# Patient Record
Sex: Male | Born: 1965 | Race: White | Hispanic: No | Marital: Married | Smoking: Never smoker
Health system: Southern US, Community
[De-identification: ages and names within clinical notes are randomized; demographics above are authoritative.]

## PROBLEM LIST (undated history)

## (undated) HISTORY — PX: KNEE SURGERY: SHX244

---

## 2009-09-27 ENCOUNTER — Encounter: Admission: RE | Admit: 2009-09-27 | Discharge: 2009-09-27 | Payer: Self-pay | Admitting: Orthopedic Surgery

## 2009-09-27 ENCOUNTER — Inpatient Hospital Stay (HOSPITAL_COMMUNITY): Admission: AD | Admit: 2009-09-27 | Discharge: 2009-09-29 | Payer: Self-pay | Admitting: Orthopedic Surgery

## 2010-09-15 IMAGING — RF DG FLUORO GUIDE SPINAL/SI JT INJ*R*
1 series · 1 of 1 positions shown · non-contrast
Comparison: none

CLINICAL DATA: Severe low back pain after playing soccer.  MR
demonstrates small left paracentral protrusion L4-5 without
compressive pathology.  No prior lumbar surgery.

[Series 1: run · 1 of 1 slices shown]
[im 1/1]
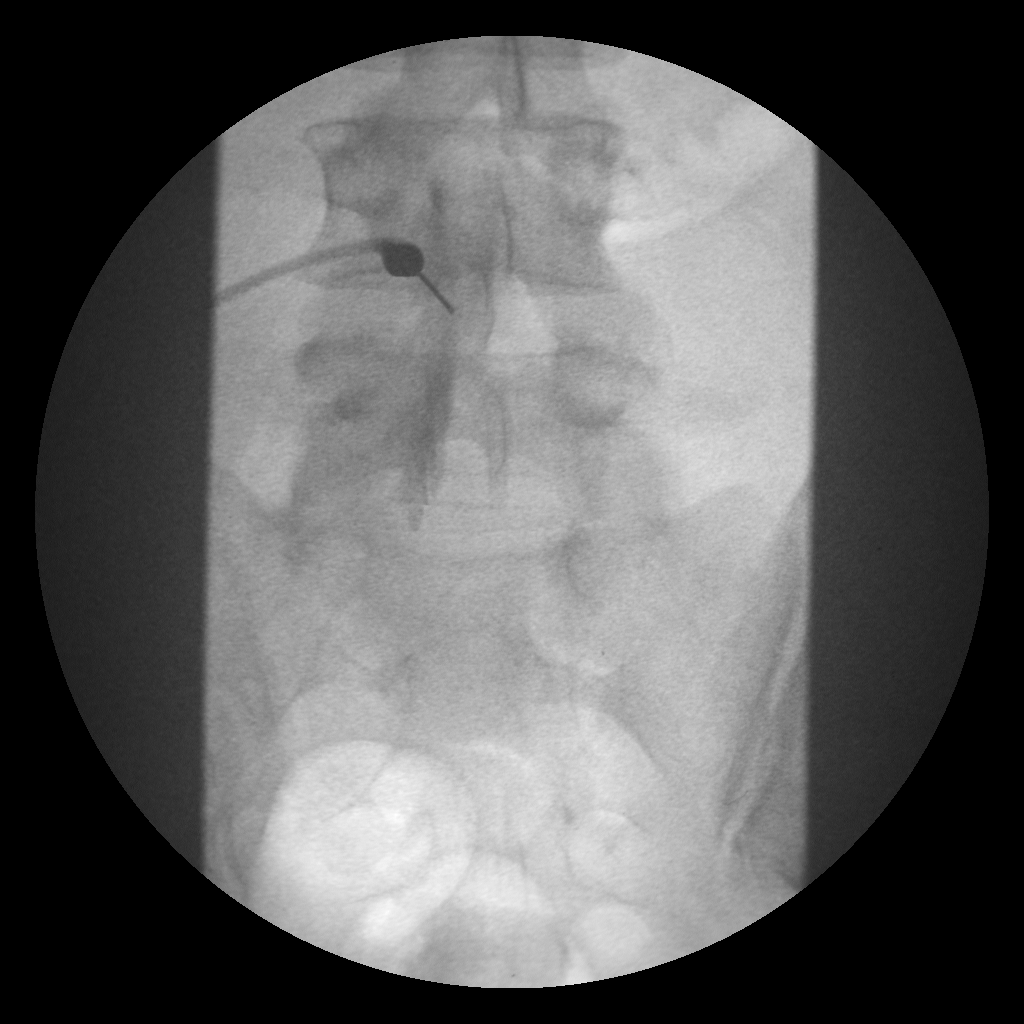

[1 of 1 positions shown; findings below may reference images not displayed]

Procedure: The procedure, risks, benefits, and alternatives were
explained to the patient. Questions regarding the procedure were
encouraged and answered. The patient understands and consents to
the procedure.

LUMBAR EPIDURAL INJECTION: An interlaminar approach was performed
on the left at L4-5.  The overlying skin was cleansed and
anesthetized.  A 20 gauge Crawford epidural needle was advanced
using loss-of-resistance technique.

DIAGNOSTIC EPIDURAL INJECTION: Injection of Omnipaque 180 shows a
good epidural pattern with spread above and below the level of
needle placement. No intrathecal or vascular opacification is seen.

THERAPEUTIC EPIDURAL INJECTION: 120mg of Depo-Medrol mixed with 5ml
lidocaine 1% were instilled.  The procedure was well-tolerated, and
the patient was discharged thirty minutes following the injection
in good condition.

Complications: none
IMPRESSION: Technically successful epidural injection on the left
at L4-5.

## 2010-11-14 LAB — BASIC METABOLIC PANEL
CO2: 24 mEq/L (ref 19–32)
Calcium: 9 mg/dL (ref 8.4–10.5)
Chloride: 104 mEq/L (ref 96–112)
Creatinine, Ser: 0.93 mg/dL (ref 0.4–1.5)
Glucose, Bld: 126 mg/dL — ABNORMAL HIGH (ref 70–99)
Sodium: 136 mEq/L (ref 135–145)

## 2010-11-14 LAB — CBC
MCV: 95.7 fL (ref 78.0–100.0)
RDW: 11.9 % (ref 11.5–15.5)
WBC: 11.3 10*3/uL — ABNORMAL HIGH (ref 4.0–10.5)

## 2010-11-14 LAB — PROTIME-INR
INR: 1.06 (ref 0.00–1.49)
Prothrombin Time: 13.7 seconds (ref 11.6–15.2)

## 2013-10-13 ENCOUNTER — Encounter: Payer: Self-pay | Admitting: Emergency Medicine

## 2013-10-13 ENCOUNTER — Emergency Department
Admission: EM | Admit: 2013-10-13 | Discharge: 2013-10-13 | Disposition: A | Discharge status: Home / Self Care | Payer: BC Managed Care – PPO | Source: Home / Self Care | Attending: Emergency Medicine | Admitting: Emergency Medicine

## 2013-10-13 DIAGNOSIS — J0100 Acute maxillary sinusitis, unspecified: Secondary | ICD-10-CM

## 2013-10-13 DIAGNOSIS — J209 Acute bronchitis, unspecified: Secondary | ICD-10-CM

## 2013-10-13 DIAGNOSIS — J111 Influenza due to unidentified influenza virus with other respiratory manifestations: Secondary | ICD-10-CM

## 2013-10-13 MED ORDER — METHYLPREDNISOLONE ACETATE 80 MG/ML IJ SUSP
80.0000 mg | Freq: Once | INTRAMUSCULAR | Status: AC
  Administered 2013-10-13: 80 mg via INTRAMUSCULAR

## 2013-10-13 MED ORDER — OSELTAMIVIR PHOSPHATE 75 MG PO CAPS: ORAL_CAPSULE | ORAL | Status: DC

## 2013-10-13 NOTE — ED Notes (Signed)
Biff c/o sinus pain, HA, congestion, and cough x 1 week. Low grade fever.

## 2013-10-13 NOTE — ED Provider Notes (Signed)
CSN: [id number]     Arrival date & time 10/13/13  1620 History   First MD Initiated Contact with Patient 10/13/13 1650     Chief Complaint  Patient presents with  . Sinus Problem  . Nasal Congestion   Molly MaduroRobert is here with his wife.  Calib c/o sinus pain, HA, congestion, and cough x 1 week.-then develop discolored nasal discharge, chest congestion, cough, nonproductive.  HPI Separate from the above symptoms for the past week, the following acute symptoms started abruptly at 3 AM this morning:  HPI : Fever to 102 with chills, sweats, myalgias, fatigue, headache. Symptoms are progressively worsening, despite trying OTC fever reducing medicine and rest and fluids. Has decreased appetite, but tolerating some liquids by mouth. No history of recent tick bite. His wife just recovered from influenza.  Review of Systems: Positive for fatigue,  nasal congestion, mild sore throat, mild swollen anterior neck glands, mild cough. Occasional wheezes, and he states that he can get wheezes in the past when he gets acute bronchitis, although he denies history of asthma. He states that steroid shot has worked well in the past for wheezing in this situation. Negative for acute vision changes, stiff neck, focal weakness, syncope, seizures, respiratory distress, vomiting, diarrhea, GU symptoms, new Rash.   History reviewed. No pertinent past medical history. Past Surgical History  Procedure Laterality Date  . Knee surgery     Family History  Problem Relation Age of Onset  . Cancer Mother   . Heart disease Father    History  Substance Use Topics  . Smoking status: Never Smoker   . Smokeless tobacco: Never Used  . Alcohol Use: No    Review of Systems  All other systems reviewed and are negative.      Allergies  Review of patient's allergies indicates no known allergies.  Home Medications   Current Outpatient Rx  Name  Route  Sig  Dispense  Refill  . oseltamivir (TAMIFLU) 75 MG capsule       Starting today, take 1 capsule by mouth twice a day for 5 days.   10 capsule   0    BP 134/85  Pulse 88  Temp(Src) 98.2 F (36.8 C) (Oral)  Resp 16  Ht 5\' 11"  (1.803 m)  Wt 214 lb (97.07 kg)  BMI 29.86 kg/m2  SpO2 97% Physical Exam  Nursing note and vitals reviewed. Constitutional: He is oriented to person, place, and time. He appears well-developed and well-nourished. He is cooperative.  Non-toxic appearance. He appears ill. No distress.  HENT:  Head: Normocephalic and atraumatic.  Right Ear: Tympanic membrane, external ear and ear canal normal.  Left Ear: Tympanic membrane, external ear and ear canal normal.  Nose: Mucosal edema and rhinorrhea present. Right sinus exhibits maxillary sinus tenderness. Left sinus exhibits maxillary sinus tenderness.  Mouth/Throat: Oropharynx is clear and moist. No oral lesions. No oropharyngeal exudate.  Eyes: Right eye exhibits no discharge. Left eye exhibits no discharge. No scleral icterus.  Neck: Neck supple.  Cardiovascular: Normal rate, regular rhythm and normal heart sounds.   Pulmonary/Chest: Effort normal. No respiratory distress. He has no decreased breath sounds. He has wheezes (mild late expiratory wheezes anterior lung field.--no other wheezes otherwise.). He has rhonchi. He has no rales.  Lymphadenopathy:    He has no cervical adenopathy.  Neurological: He is alert and oriented to person, place, and time.  Skin: Skin is warm. No rash noted. He is diaphoretic.  Psychiatric: He has a normal mood  and affect.    ED Course  Procedures (including critical care time) Labs Review Labs Reviewed - No data to display Imaging Review No results found.    MDM   Final diagnoses:  Influenza with other respiratory manifestations  Acute maxillary sinusitis  Acute bronchitis    He likely has 2 separate infections. The one the past week is likely bacterial acute sinusitis/bronchitis. Separate from this, based on history of acute  abrupt onset of typical influenza symptoms, exposure to someone with the flu, as well as the flu epidemic in this region, he likely has influenza. Risk, benefits, alternatives discussed. He declined any imaging or testing. He agrees with the following plan: Tamiflu 75 mg BID times five days. Zithromax Z Pak Depo-Medrol 80 mg IM stat Flonase Other symptomatic care discussed Precautions discussed. Follow-up with your primary care doctor in 5-7 days if not improving, or sooner if symptoms become worse. Red flags discussed. Questions invited and answered. Patient and wife voiced understanding and agreement.    Lajean Manes, MD 10/13/13 [phone]

## 2017-11-26 ENCOUNTER — Encounter: Payer: Self-pay | Admitting: *Deleted

## 2017-11-26 ENCOUNTER — Emergency Department
Admission: EM | Admit: 2017-11-26 | Discharge: 2017-11-26 | Disposition: A | Discharge status: Home / Self Care | Payer: BLUE CROSS/BLUE SHIELD | Source: Home / Self Care | Attending: Family Medicine | Admitting: Family Medicine

## 2017-11-26 ENCOUNTER — Other Ambulatory Visit: Payer: Self-pay

## 2017-11-26 DIAGNOSIS — H579 Unspecified disorder of eye and adnexa: Secondary | ICD-10-CM

## 2017-11-26 MED ORDER — SULFACETAMIDE SODIUM 10 % OP SOLN: 1.0000 [drp] | OPHTHALMIC | 0 refills | Status: AC

## 2017-11-26 MED ORDER — KETOROLAC TROMETHAMINE 0.5 % OP SOLN: 1.0000 [drp] | Freq: Four times a day (QID) | OPHTHALMIC | 0 refills | Status: AC

## 2017-11-26 NOTE — ED Provider Notes (Signed)
[facility]    CSN: [id number] Arrival date & time: 11/26/17  1825     History   Chief Complaint Chief Complaint  Patient presents with  . Eye Injury    HPI Jordan Bolton is a 52 y.o. male.   Patient reports that he was sawing with a circular saw and got dust in his left eye.  He lavaged his eye but still has a foreign body sensation.  No changes in vision   Foreign Body in Eye  This is a new problem. The current episode started yesterday. The problem occurs constantly. The problem has not changed since onset.Nothing aggravates the symptoms. Nothing relieves the symptoms. Treatments tried: eye lavage. The treatment provided no relief.    History reviewed. No pertinent past medical history.  There are no active problems to display for this patient.   Past Surgical History:  Procedure Laterality Date  . KNEE SURGERY         Home Medications    Prior to Admission medications   Medication Sig Start Date End Date Taking? Authorizing Provider  ketorolac (ACULAR) 0.5 % ophthalmic solution Place 1 drop into the left eye 4 (four) times daily. 11/26/17   Lattie HawBeese, Howell Groesbeck A, MD  sulfacetamide (BLEPH-10) 10 % ophthalmic solution Place 1-2 drops into the left eye every 3 (three) hours while awake. 11/26/17   Lattie HawBeese, Bron Snellings A, MD    Family History Family History  Problem Relation Age of Onset  . Cancer Mother   . Heart disease Father     Social History Social History   Tobacco Use  . Smoking status: Never Smoker  . Smokeless tobacco: Never Used  Substance Use Topics  . Alcohol use: No  . Drug use: No     Allergies   Patient has no known allergies.   Review of Systems Review of Systems  All other systems reviewed and are negative.    Physical Exam Triage Vital Signs ED Triage Vitals  Enc Vitals Group     BP 11/26/17 1858 134/81     Pulse Rate 11/26/17 1858 67     Resp 11/26/17 1858 18     Temp 11/26/17 1858 98.3 F (36.8 C)     Temp Source  11/26/17 1858 Oral     SpO2 11/26/17 1858 98 %     Weight 11/26/17 1859 214 lb (97.1 kg)     Height 11/26/17 1859 5\' 11"  (1.803 m)     Head Circumference --      Peak Flow --      Pain Score 11/26/17 1859 1     Pain Loc --      Pain Edu? --      Excl. in GC? --    No data found.  Updated Vital Signs BP 134/81 (BP Location: Right Arm)   Pulse 67   Temp 98.3 F (36.8 C) (Oral)   Resp 18   Ht 5\' 11"  (1.803 m)   Wt 214 lb (97.1 kg)   SpO2 98%   BMI 29.85 kg/m   Visual Acuity Right Eye Distance:   Left Eye Distance:   Bilateral Distance:    Right Eye Near:   Left Eye Near:    Bilateral Near:     Physical Exam  Constitutional: He appears well-developed and well-nourished. No distress.  HENT:  Head: Normocephalic.  Eyes: Pupils are equal, round, and reactive to light. Conjunctivae, EOM and lids are normal. Lids are everted and swept, no foreign bodies  found. Right eye exhibits no chemosis, no discharge, no exudate and no hordeolum. No foreign body present in the right eye.  Lid eversion left eye reveals no foreign body.  Fluorescein reveals no uptake.  Cardiovascular: Normal rate.  Pulmonary/Chest: Effort normal.  Neurological: He is alert.  Skin: Skin is warm and dry.  Nursing note and vitals reviewed.    UC Treatments / Results  Labs (all labs ordered are listed, but only abnormal results are displayed) Labs Reviewed - No data to display  EKG None Radiology No results found.  Procedures Procedures (including critical care time)  Medications Ordered in UC Medications - No data to display   Initial Impression / Assessment and Plan / UC Course  I have reviewed the triage vital signs and the nursing notes.  Pertinent labs & imaging results that were available during my care of the patient were reviewed by me and considered in my medical decision making (see chart for details).    No foreign body visualized.  No corneal or conjunctival abrasion  noted. Will empirically treat with Acular and sulfacetamide ophthalmic suspension. Followup with ophthalmologist if not improved 3 days.    Final Clinical Impressions(s) / UC Diagnoses   Final diagnoses:  Sensation of foreign body in eye    ED Discharge Orders        Ordered    sulfacetamide (BLEPH-10) 10 % ophthalmic solution  Every  3 hours while awake     11/26/17 1916    ketorolac (ACULAR) 0.5 % ophthalmic solution  4 times daily     11/26/17 1916          Lattie Haw, MD 12/03/17 1112

## 2017-11-26 NOTE — ED Triage Notes (Signed)
Pt c/o LT eye pain x 1 day. He reports getting saw dust in his eye.

## 2021-12-16 ENCOUNTER — Encounter: Payer: Self-pay | Admitting: Podiatry

## 2021-12-16 ENCOUNTER — Ambulatory Visit (INDEPENDENT_AMBULATORY_CARE_PROVIDER_SITE_OTHER): Payer: BC Managed Care – PPO

## 2021-12-16 ENCOUNTER — Ambulatory Visit: Payer: BC Managed Care – PPO | Admitting: Podiatry

## 2021-12-16 DIAGNOSIS — M79671 Pain in right foot: Secondary | ICD-10-CM

## 2021-12-16 DIAGNOSIS — M722 Plantar fascial fibromatosis: Secondary | ICD-10-CM | POA: Diagnosis not present

## 2021-12-16 DIAGNOSIS — M79672 Pain in left foot: Secondary | ICD-10-CM | POA: Diagnosis not present

## 2021-12-16 MED ORDER — MELOXICAM 15 MG PO TABS: 15.0000 mg | ORAL_TABLET | Freq: Every day | ORAL | 0 refills | Status: DC

## 2021-12-16 NOTE — Progress Notes (Signed)
?  Subjective:  ?Patient ID: Jordan Bolton, male    DOB: 01-Oct-1965,   MRN: [id number] ? ?Chief Complaint  ?Patient presents with  ? Foot Pain  ?  bil foot heel - right foot pain worst than left  ? ? ?56 y.o. male presents for concern of bilateral heel pain that has been going on for a few months. Relates right is worse than the left. Relates the pain is constantly in his heel when walking. Has tried new shoes without much difference. Denies other treatments. Not diabetic . Denies any other pedal complaints. Denies n/v/f/c.  ? ?History reviewed. No pertinent past medical history. ? ?Objective:  ?Physical Exam: ?Vascular: DP/PT pulses 2/4 bilateral. CFT <3 seconds. Normal hair growth on digits. No edema.  ?Skin. No lacerations or abrasions bilateral feet.  ?Musculoskeletal: MMT 5/5 bilateral lower extremities in DF, PF, Inversion and Eversion. Deceased ROM in DF of ankle joint. Pain to medial calcaneal tubercle bilateral more so on the right. No pain along PT or achilles tendon. No pain in arch or with calcaneal squeeze.  ?Neurological: Sensation intact to light touch.  ? ?Assessment:  ? ?1. Plantar fasciitis of right foot   ?2. Plantar fasciitis of left foot   ? ? ? ?Plan:  ?Patient was evaluated and treated and all questions answered. ?Discussed plantar fasciitis with patient.  ?X-rays reviewed and discussed with patient. No acute fractures or dislocations noted. Mild spurring noted at inferior calcaneus.  ?Discussed treatment options including, ice, NSAIDS, supportive shoes, bracing, and stretching. Stretching exercises provided to be done on a daily basis.   ?Prescription for meloxicam provided and sent to pharmacy. ?PF brace dispensed.  ?Follow-up 6 weeks or sooner if any problems arise. In the meantime, encouraged to call the office with any questions, concerns, change in symptoms. ? ? ?Lorenda Peck, DPM  ? ? ?

## 2021-12-16 NOTE — Patient Instructions (Signed)

## 2022-01-27 ENCOUNTER — Ambulatory Visit: Payer: BC Managed Care – PPO | Admitting: Podiatry

## 2022-03-03 ENCOUNTER — Ambulatory Visit: Payer: BC Managed Care – PPO | Admitting: Podiatry

## 2022-03-03 ENCOUNTER — Encounter: Payer: Self-pay | Admitting: Podiatry

## 2022-03-03 DIAGNOSIS — M722 Plantar fascial fibromatosis: Secondary | ICD-10-CM | POA: Diagnosis not present

## 2022-03-03 MED ORDER — MELOXICAM 15 MG PO TABS: 15.0000 mg | ORAL_TABLET | Freq: Every day | ORAL | 0 refills | Status: AC

## 2022-03-03 MED ORDER — TRIAMCINOLONE ACETONIDE 10 MG/ML IJ SUSP
10.0000 mg | Freq: Once | INTRAMUSCULAR | Status: AC
  Administered 2022-03-03: 10 mg

## 2022-03-03 NOTE — Patient Instructions (Signed)

## 2022-03-04 NOTE — Progress Notes (Signed)
Subjective:   Patient ID: Jordan Bolton, male   DOB: 56 y.o.   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  I did discuss other treatments we may need to do and hopefully this will solve the problem patient to be seen back orthotics return or earlier if needed

## 2022-03-22 ENCOUNTER — Telehealth: Payer: Self-pay | Admitting: Podiatry

## 2022-03-22 NOTE — Telephone Encounter (Signed)
Lmom to call back to schedule appt to pick up orthotics  

## 2022-04-13 ENCOUNTER — Ambulatory Visit (INDEPENDENT_AMBULATORY_CARE_PROVIDER_SITE_OTHER): Payer: BC Managed Care – PPO

## 2022-04-13 DIAGNOSIS — M722 Plantar fascial fibromatosis: Secondary | ICD-10-CM

## 2022-04-13 NOTE — Progress Notes (Signed)
  Orthotics were dispensed and fit was satisfactory. Reviewed instructions for break-in and wear. Written instructions given to patient.  Patient will follow up as needed.   [facility] - order # [id number]

## 2022-12-02 IMAGING — DX DG FOOT COMPLETE 3+V*R*
3 series · 3 of 3 positions shown · non-contrast
Comparison: None.

CLINICAL DATA: Bilateral heel pain, right greater than left related
to playing pickleball

EXAM:
RIGHT FOOT COMPLETE - 3+ VIEW

[foot ap wb]
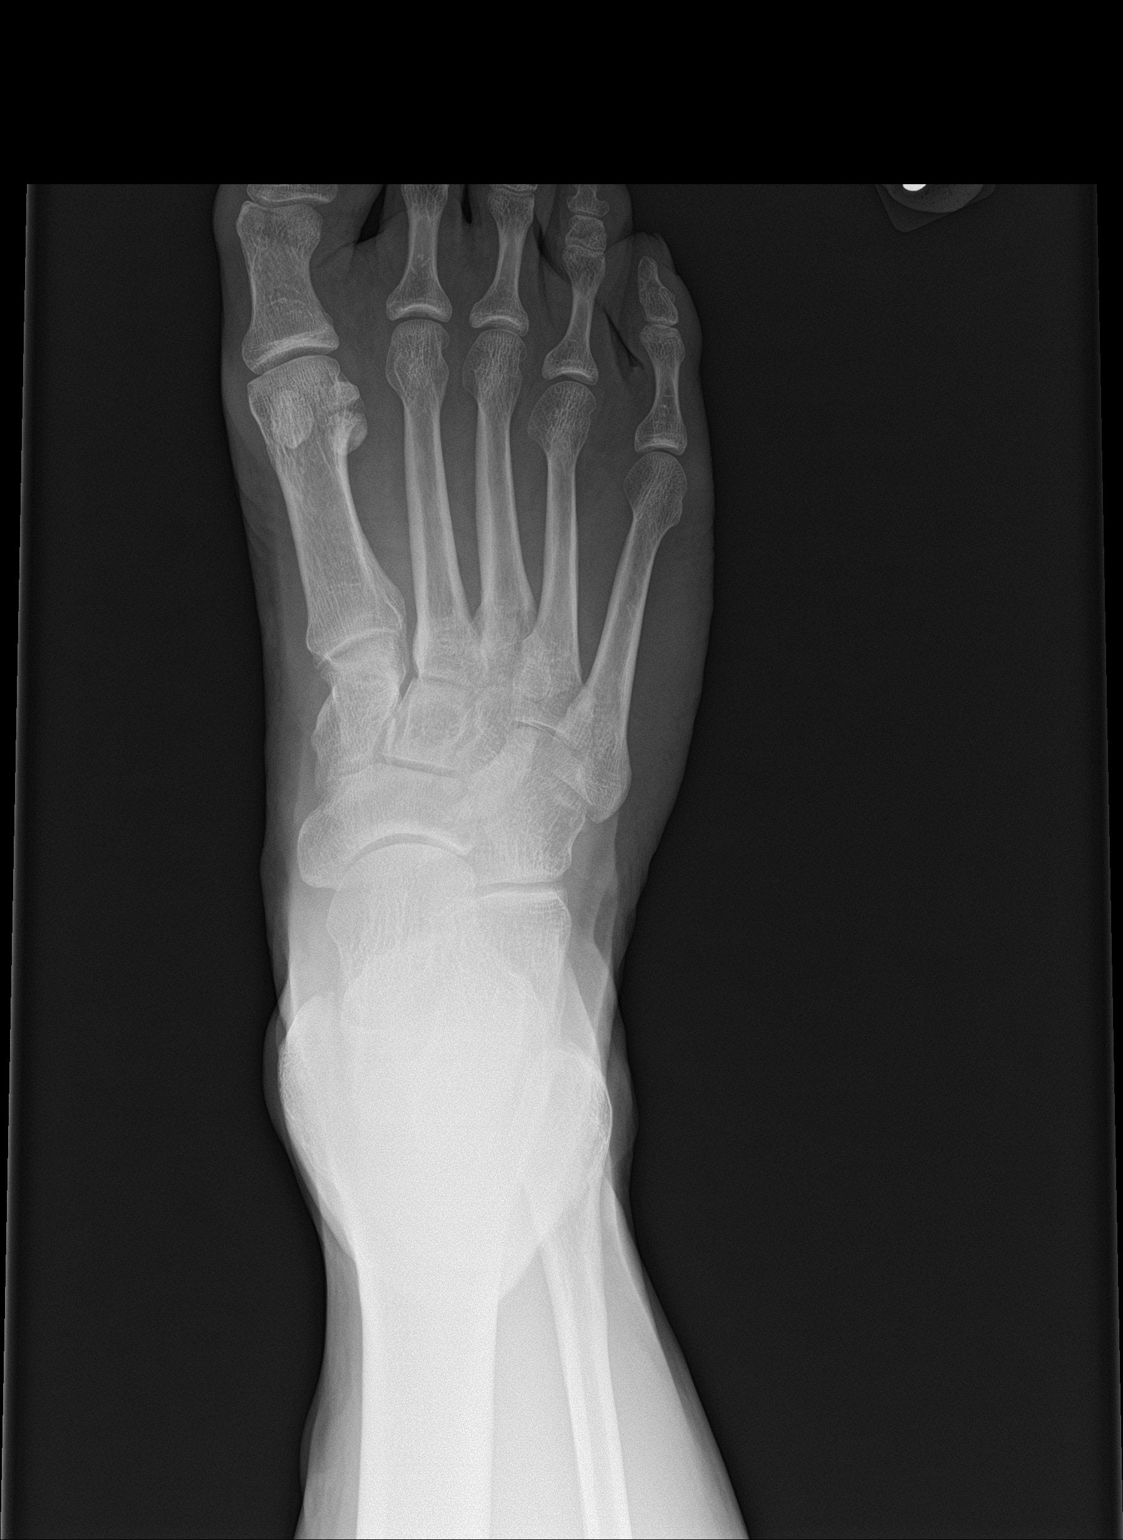

[foot obl wb]
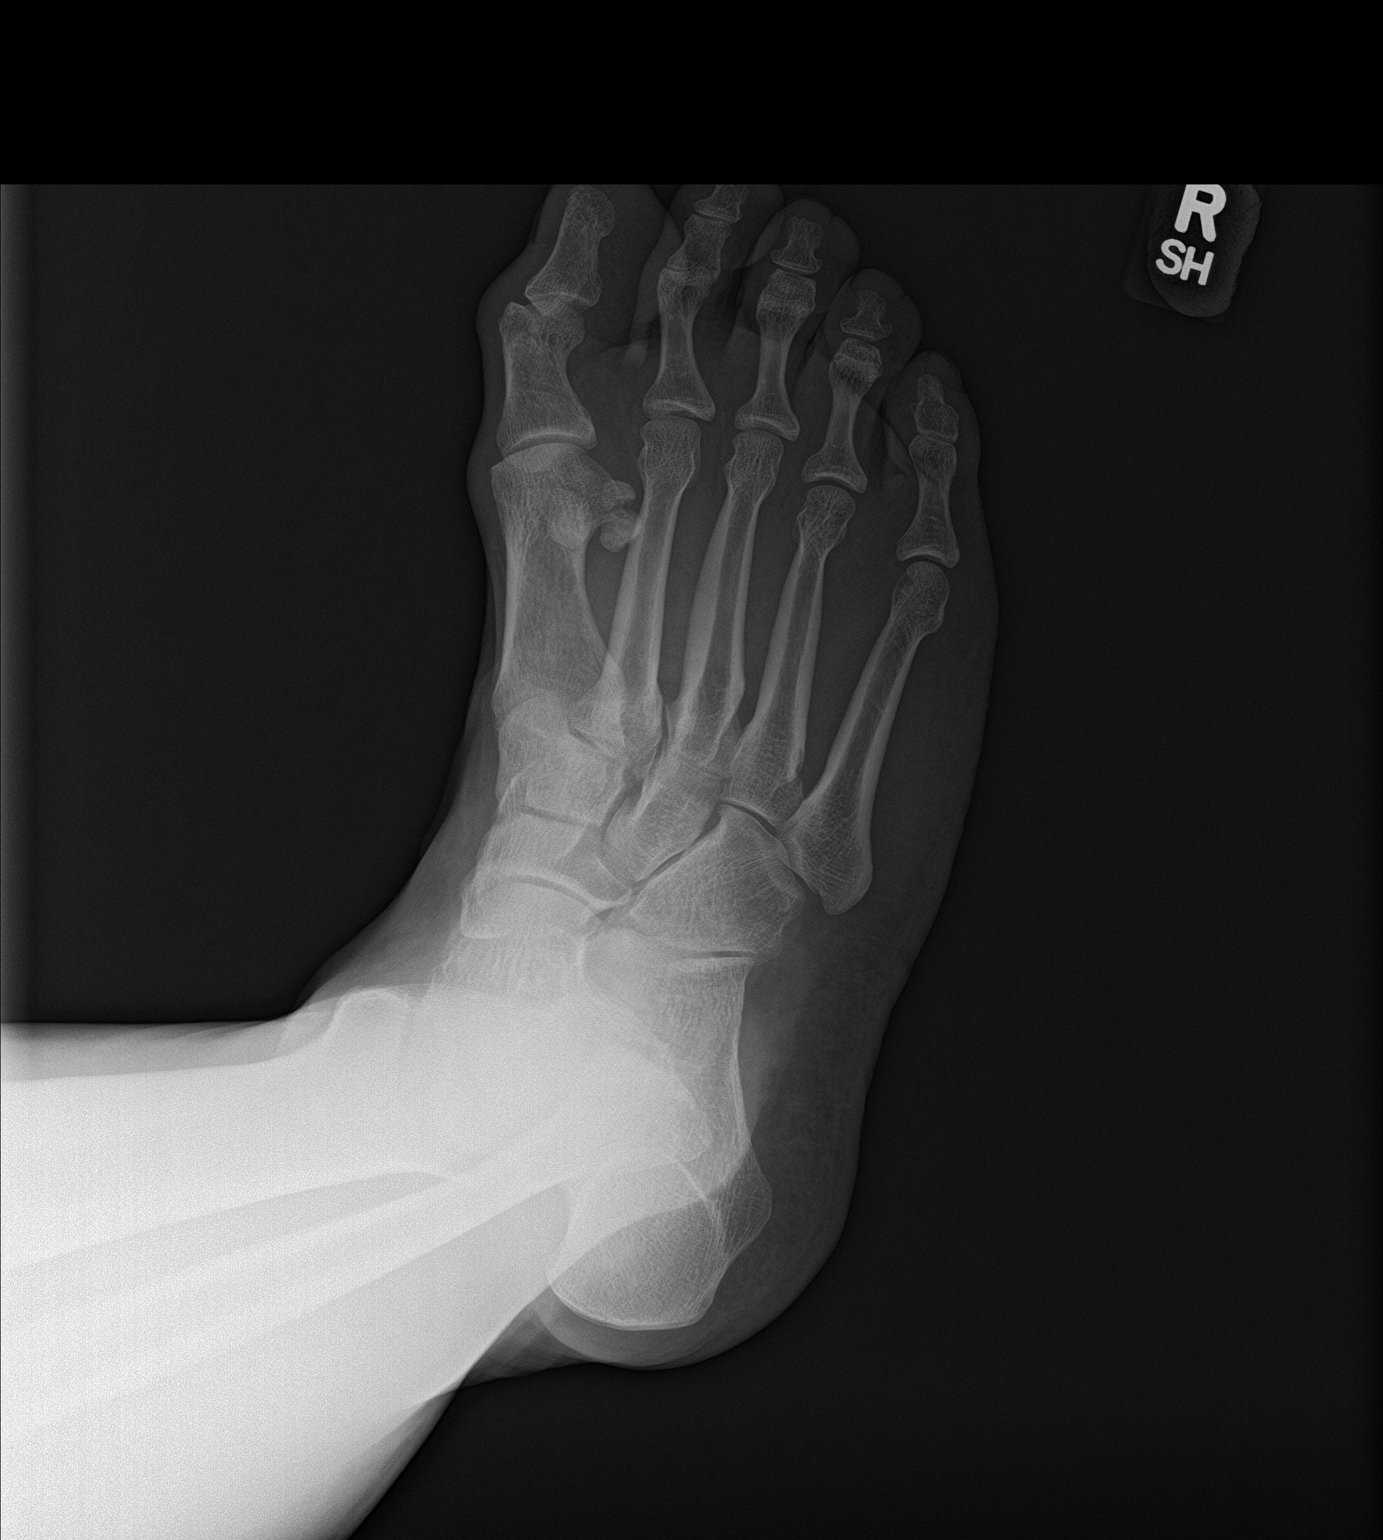

[foot lat wb]
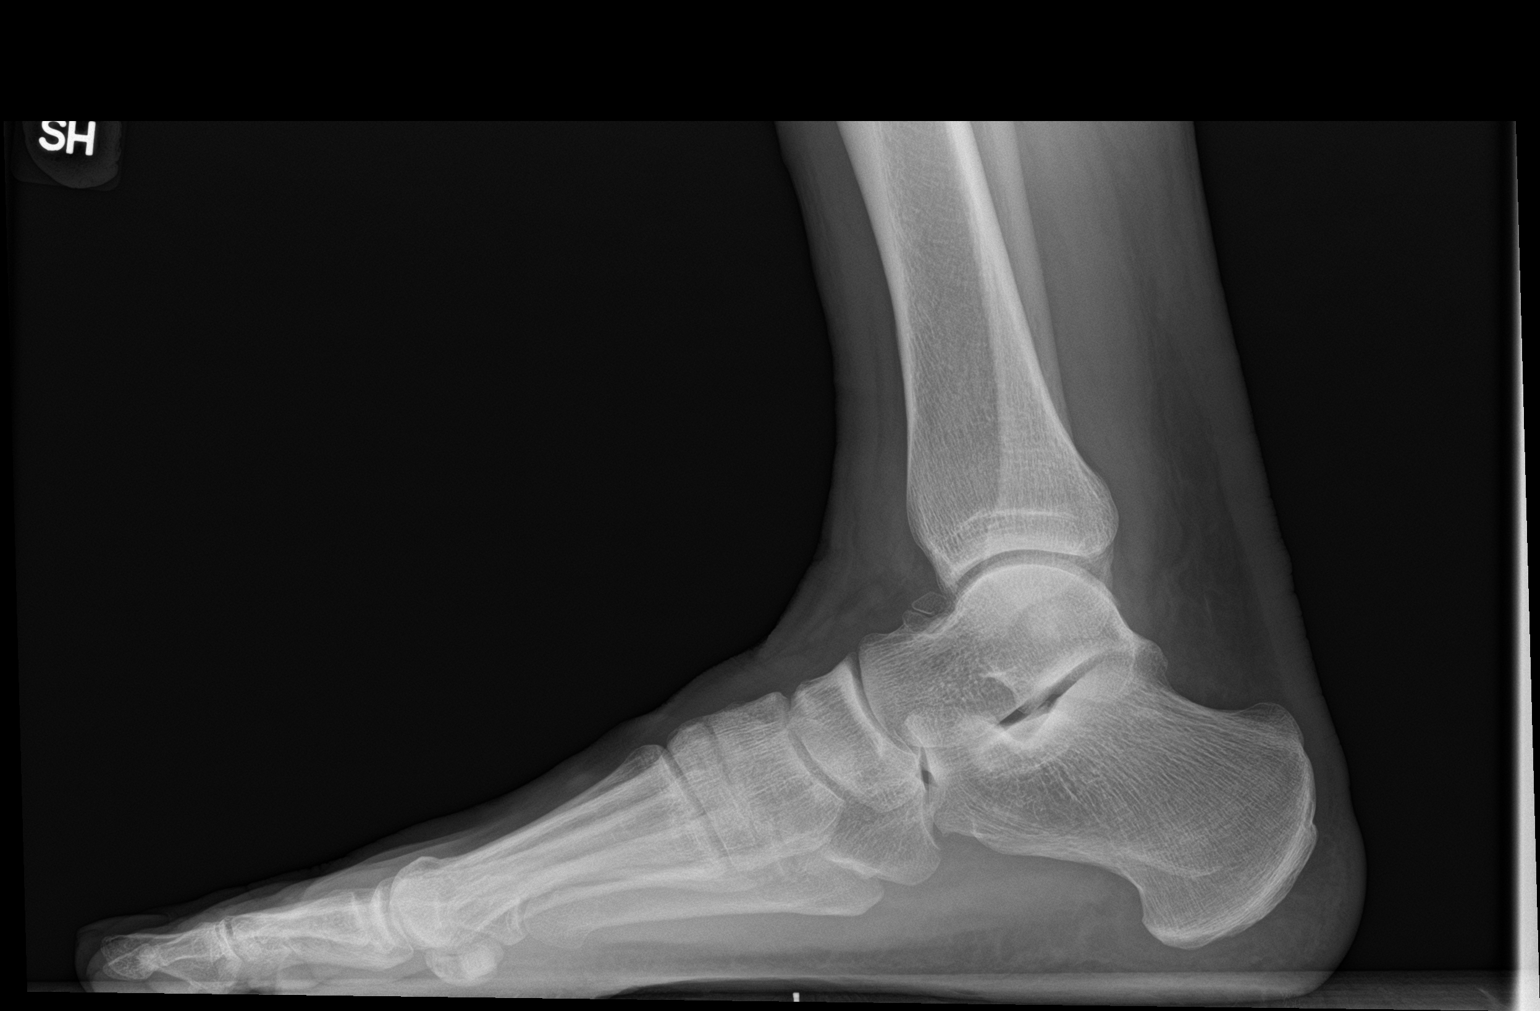

[3 of 3 positions shown; findings below may reference images not displayed]

FINDINGS: There is no evidence of fracture or dislocation. There is no
evidence of arthropathy or other focal bone abnormality. Soft
tissues are unremarkable. Bipartite lateral right first metatarsal
sesamoid.
IMPRESSION: No acute osseous abnormality.  No significant arthropathy.
